# Patient Record
Sex: Male | Born: 1995 | Race: White | Hispanic: No | Marital: Single | Smoking: Never smoker
Health system: Southern US, Community
[De-identification: ages and names within clinical notes are randomized; demographics above are authoritative.]

## PROBLEM LIST (undated history)

## (undated) DIAGNOSIS — R197 Diarrhea, unspecified: Secondary | ICD-10-CM

## (undated) DIAGNOSIS — R110 Nausea: Secondary | ICD-10-CM

## (undated) DIAGNOSIS — R109 Unspecified abdominal pain: Secondary | ICD-10-CM

## (undated) HISTORY — DX: Nausea: R11.0

## (undated) HISTORY — DX: Diarrhea, unspecified: R19.7

## (undated) HISTORY — DX: Unspecified abdominal pain: R10.9

---
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
## 2015-05-09 IMAGING — US US ABDOMEN COMPLETE
1 series · 14 of 25 positions shown · non-contrast
Comparison: None.

CLINICAL DATA: Abdominal pain, diarrhea

EXAM:
ULTRASOUND ABDOMEN COMPLETE

[Series 1: us abdomen complete · 0.22mm/px · 14 of 90 slices shown]
[im 1/90]
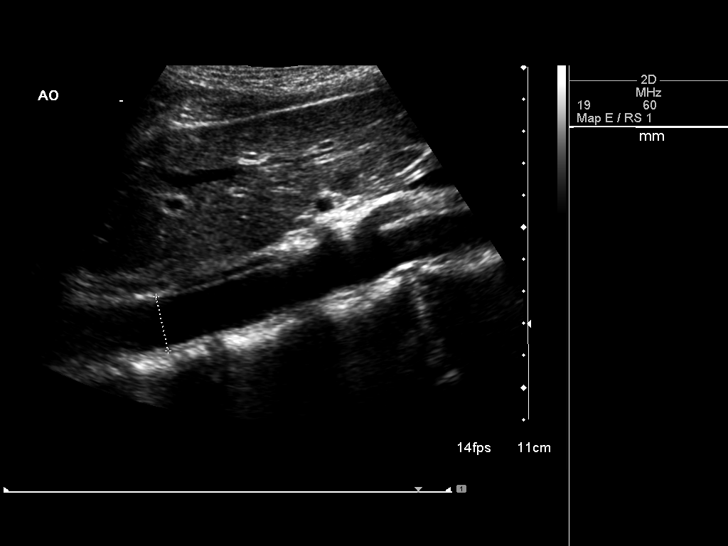
[im 8/90]
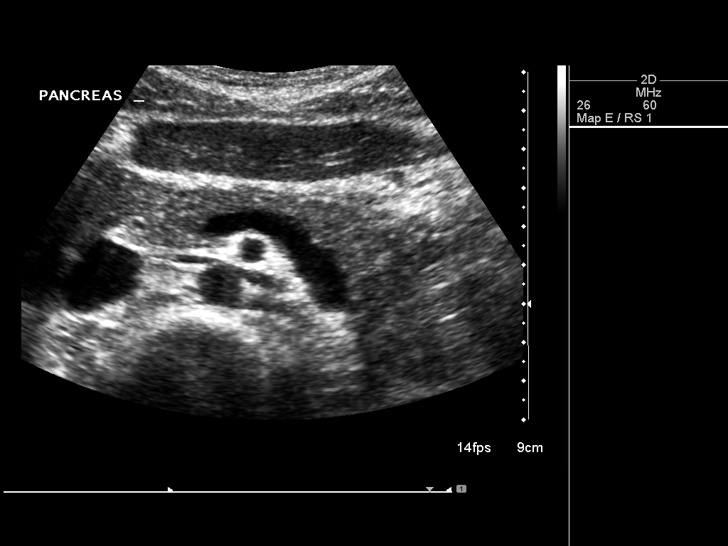
[im 15/90]
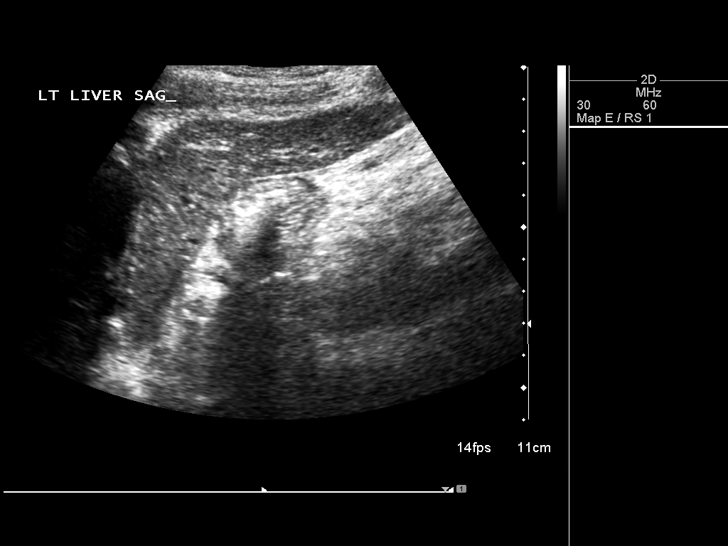
[im 23/90]
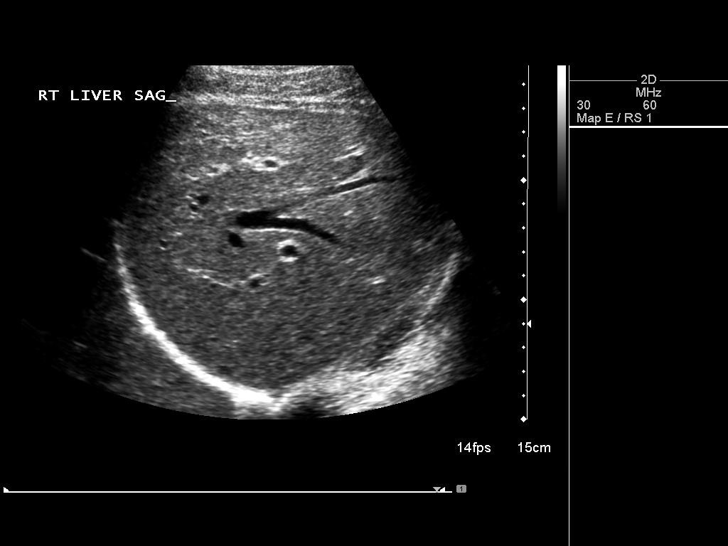
[im 30/90]
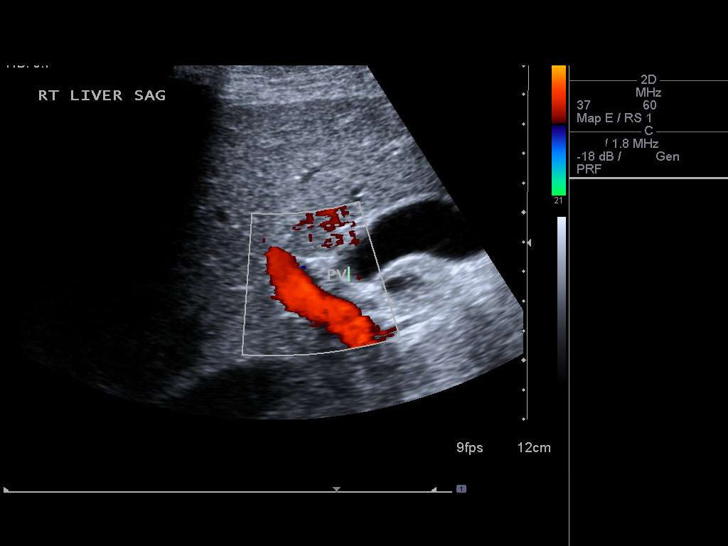
[im 34/90]
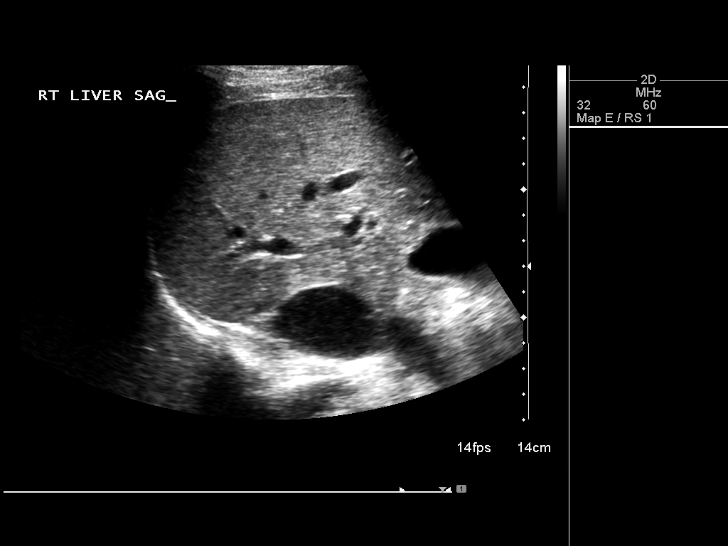
[im 41/90]
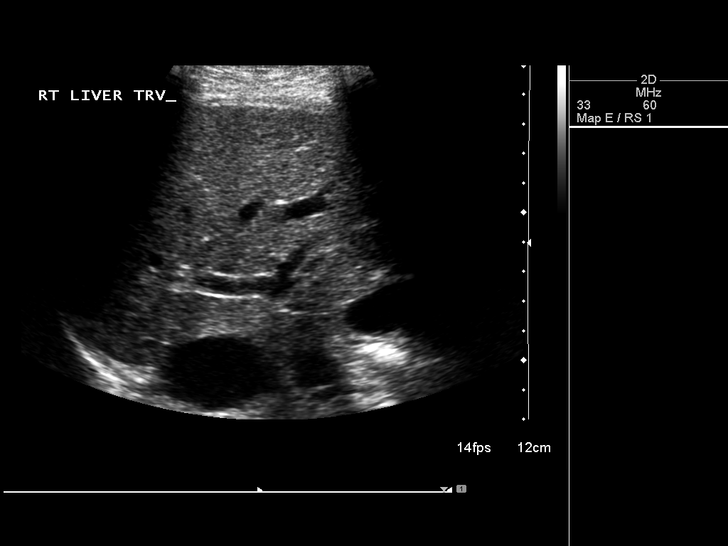
[im 49/90]
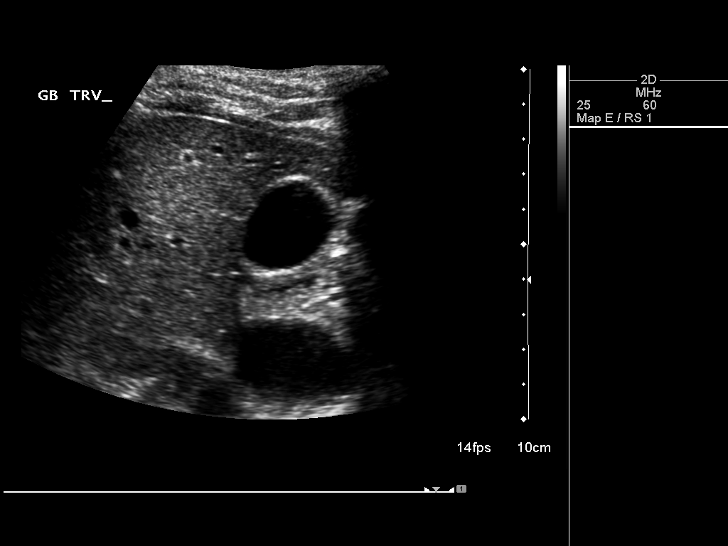
[im 56/90]
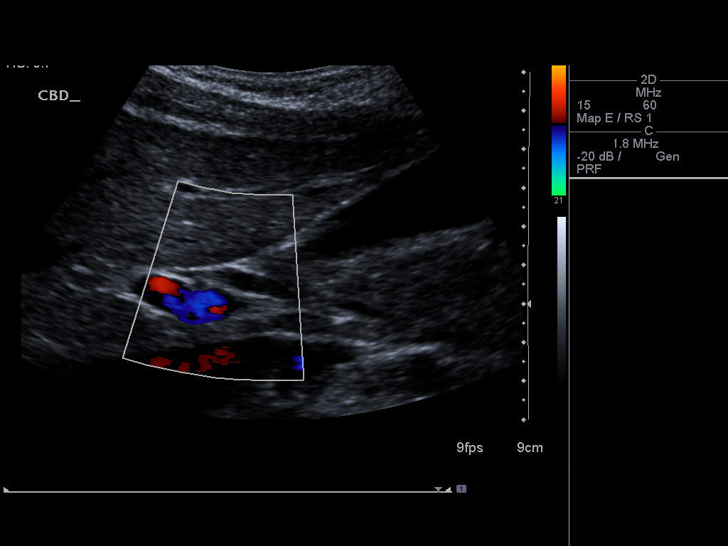
[im 60/90]
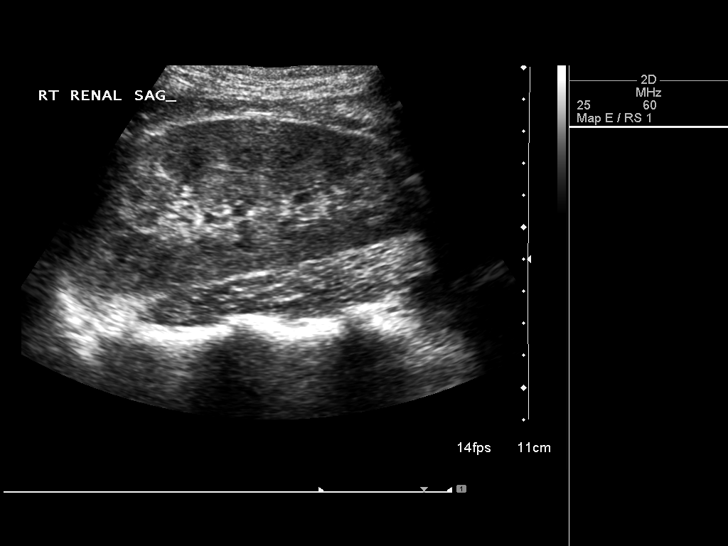
[im 67/90]
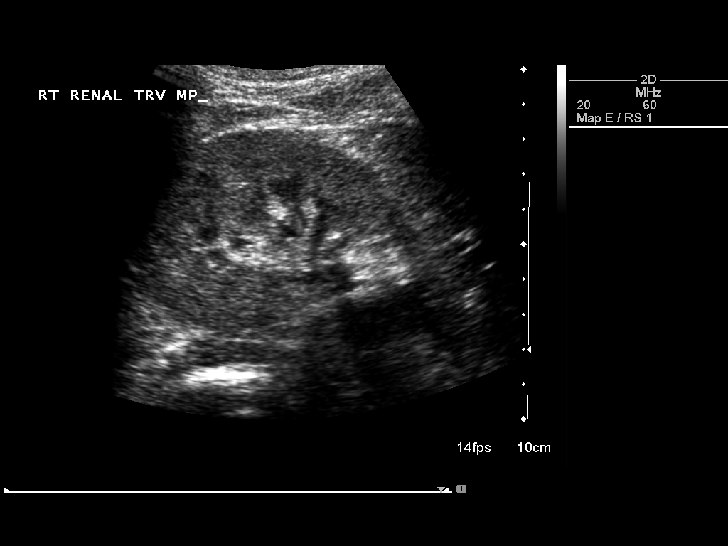
[im 75/90]
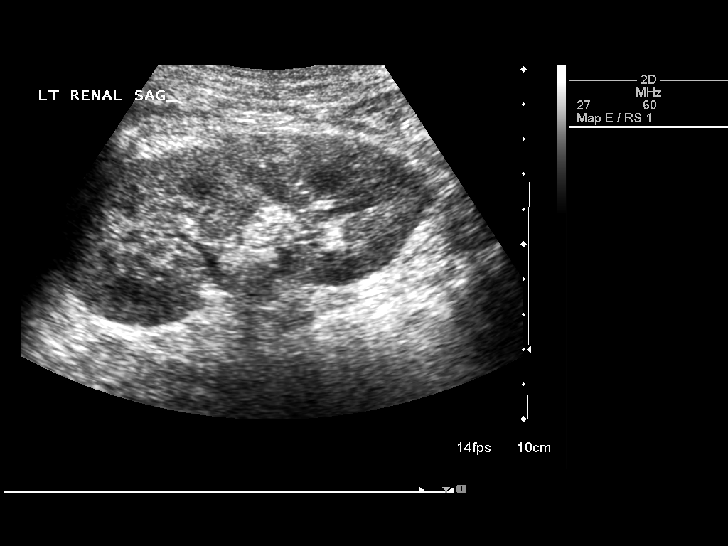
[im 82/90]
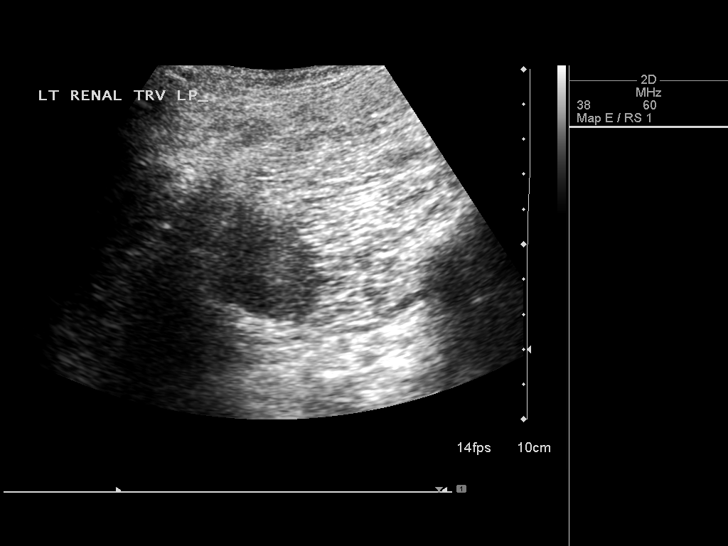
[im 90/90]
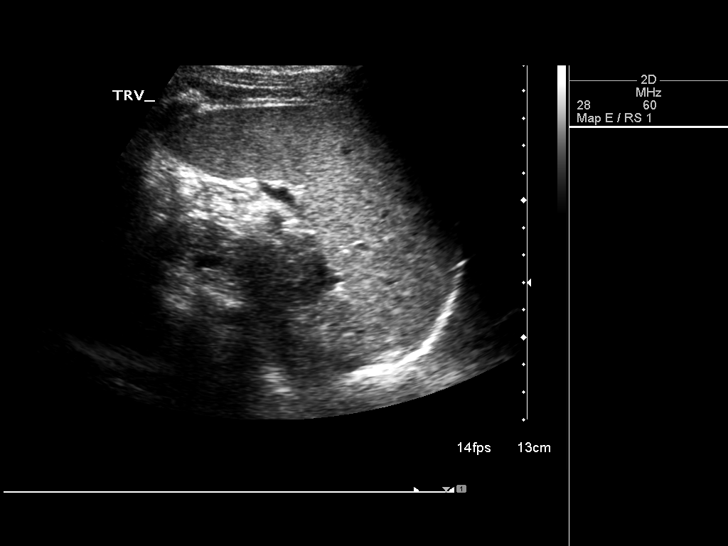

[14 of 25 positions shown; findings below may reference images not displayed]

FINDINGS: Gallbladder:

The gallbladder is visualized and no gallstones are noted. There is
no pain over the gallbladder with compression.

Common bile duct:

Diameter: The common bile duct is normal measuring 5.0 mm in
diameter.

Liver:

The liver has a normal echogenic pattern. No focal abnormality is
seen.

IVC:

No abnormality visualized.

Pancreas:

The visualized portion is unremarkable.

Spleen:

The spleen is normal measuring 10.0 cm sagittally.

Right Kidney:

Length: 11.4 cm..  No hydronephrosis is seen.

Mean renal length for age is 10.4 cm with 2 standard deviations
being 1.8 cm.

Left Kidney:

Length: 11.1 cm..  No hydronephrosis is seen.

Abdominal aorta:

The abdominal aorta is normal caliber.

Other findings:

None.
IMPRESSION: Negative abdominal ultrasound.
